# Patient Record
Sex: Female | Born: 1990 | Race: White | Hispanic: No | Marital: Single | Smoking: Never smoker
Health system: Southern US, Community
[De-identification: ages and names within clinical notes are randomized; demographics above are authoritative.]

---
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
## 2008-01-05 ENCOUNTER — Emergency Department: Payer: Self-pay | Admitting: Emergency Medicine

## 2008-04-22 ENCOUNTER — Emergency Department: Payer: Self-pay | Admitting: Emergency Medicine

## 2009-03-02 ENCOUNTER — Emergency Department: Payer: Self-pay | Admitting: Emergency Medicine

## 2009-11-18 ENCOUNTER — Inpatient Hospital Stay: Payer: Self-pay | Admitting: Internal Medicine

## 2010-02-12 ENCOUNTER — Inpatient Hospital Stay: Payer: Self-pay | Admitting: Obstetrics and Gynecology

## 2011-09-10 ENCOUNTER — Emergency Department: Payer: Self-pay | Admitting: Emergency Medicine

## 2012-10-26 ENCOUNTER — Emergency Department: Payer: Self-pay | Admitting: Emergency Medicine

## 2012-10-26 LAB — URINALYSIS, COMPLETE
Leukocyte Esterase: NEGATIVE
Nitrite: NEGATIVE
Ph: 5 (ref 4.5–8.0)
Protein: NEGATIVE
RBC,UR: 3 /HPF (ref 0–5)
Specific Gravity: 1.034 (ref 1.003–1.030)
Squamous Epithelial: <1
WBC UR: 2 /HPF (ref 0–5)

## 2013-03-28 ENCOUNTER — Emergency Department: Payer: Self-pay | Admitting: Emergency Medicine

## 2013-03-28 LAB — URINALYSIS, COMPLETE
Protein: NEGATIVE
RBC,UR: 2 /HPF (ref 0–5)
Squamous Epithelial: <1
WBC UR: 1 /HPF (ref 0–5)

## 2013-03-28 LAB — DRUG SCREEN, URINE
Benzodiazepine, Ur Scrn: NEGATIVE (ref ?–200)
Cannabinoid 50 Ng, Ur ~~LOC~~: NEGATIVE (ref ?–50)
Cocaine Metabolite,Ur ~~LOC~~: NEGATIVE (ref ?–300)
Methadone, Ur Screen: NEGATIVE (ref ?–300)
Phencyclidine (PCP) Ur S: NEGATIVE (ref ?–25)

## 2013-03-28 LAB — CBC
HCT: 37.9 % (ref 35.0–47.0)
MCH: 29.8 pg (ref 26.0–34.0)
MCHC: 34.2 g/dL (ref 32.0–36.0)
MCV: 87 fL (ref 80–100)
Platelet: 221 10*3/uL (ref 150–440)
RBC: 4.35 10*6/uL (ref 3.80–5.20)
RDW: 13.8 % (ref 11.5–14.5)
WBC: 6.3 10*3/uL (ref 3.6–11.0)

## 2013-03-29 LAB — BASIC METABOLIC PANEL
BUN: 12 mg/dL (ref 7–18)
Chloride: 108 mmol/L — ABNORMAL HIGH (ref 98–107)
Co2: 22 mmol/L (ref 21–32)
Osmolality: 270 (ref 275–301)
Potassium: 3.6 mmol/L (ref 3.5–5.1)
Sodium: 135 mmol/L — ABNORMAL LOW (ref 136–145)

## 2013-10-30 ENCOUNTER — Emergency Department: Payer: Self-pay | Admitting: Emergency Medicine

## 2013-12-03 ENCOUNTER — Emergency Department: Payer: Self-pay | Admitting: Emergency Medicine

## 2014-05-29 ENCOUNTER — Emergency Department: Payer: Self-pay | Admitting: Emergency Medicine

## 2014-05-31 LAB — BETA STREP CULTURE(ARMC)

## 2014-06-12 ENCOUNTER — Emergency Department: Payer: Self-pay | Admitting: Emergency Medicine

## 2014-06-15 LAB — BETA STREP CULTURE(ARMC)

## 2016-03-15 ENCOUNTER — Emergency Department: Payer: Worker's Compensation

## 2016-03-15 ENCOUNTER — Emergency Department
Admission: EM | Admit: 2016-03-15 | Discharge: 2016-03-15 | Disposition: A | Discharge status: Home / Self Care | Payer: Worker's Compensation | Attending: Emergency Medicine | Admitting: Emergency Medicine

## 2016-03-15 ENCOUNTER — Encounter: Payer: Self-pay | Admitting: Emergency Medicine

## 2016-03-15 DIAGNOSIS — S61211A Laceration without foreign body of left index finger without damage to nail, initial encounter: Secondary | ICD-10-CM | POA: Insufficient documentation

## 2016-03-15 DIAGNOSIS — Y9389 Activity, other specified: Secondary | ICD-10-CM | POA: Insufficient documentation

## 2016-03-15 DIAGNOSIS — S61311A Laceration without foreign body of left index finger with damage to nail, initial encounter: Secondary | ICD-10-CM

## 2016-03-15 DIAGNOSIS — Y929 Unspecified place or not applicable: Secondary | ICD-10-CM | POA: Diagnosis not present

## 2016-03-15 DIAGNOSIS — Y990 Civilian activity done for income or pay: Secondary | ICD-10-CM | POA: Insufficient documentation

## 2016-03-15 DIAGNOSIS — W268XXA Contact with other sharp object(s), not elsewhere classified, initial encounter: Secondary | ICD-10-CM | POA: Diagnosis not present

## 2016-03-15 MED ORDER — HYDROCODONE-ACETAMINOPHEN 5-325 MG PO TABS: 1.0000 | ORAL_TABLET | Freq: Four times a day (QID) | ORAL | Status: DC | PRN

## 2016-03-15 MED ORDER — LIDOCAINE-EPINEPHRINE-TETRACAINE (LET) SOLUTION
3.0000 mL | Freq: Once | NASAL | Status: AC
  Administered 2016-03-15: 3 mL via TOPICAL
  Filled 2016-03-15: qty 3

## 2016-03-15 NOTE — ED Notes (Signed)
States she was cleaning a meat slicer at work and cut her tip of left index finger

## 2016-03-15 NOTE — Discharge Instructions (Signed)
Please call the hand specialist tomorrow to schedule a follow up.   Nonsutured Laceration Care A laceration is a cut that goes through all layers of the skin and extends into the tissue that is right under the skin. This type of cut is usually stitched up (sutured) or closed with tape (adhesive strips) or skin glue shortly after the injury happens. However, if the wound is dirty or if several hours pass before medical treatment is provided, it is likely that germs (bacteria) will enter the wound. Closing a laceration after bacteria have entered it increases the risk of infection. In these cases, your health care provider may leave the laceration open (nonsutured) and cover it with a bandage. This type of treatment helps prevent infection and allows the wound to heal from the deepest layer of tissue damage up to the surface. An open fracture is a type of injury that may involve nonsutured lacerations. An open fracture is a break in a bone that happens along with one or more lacerations through the skin that is near the fracture site. HOW TO CARE FOR YOUR NONSUTURED LACERATION  Take or apply over-the-counter and prescription medicines only as told by your health care provider.  If you were prescribed an antibiotic medicine, take or apply it as told by your health care provider. Do not stop using the antibiotic even if your condition improves.  Clean the wound one time each day or as told by your health care provider.  Wash the wound with mild soap and water.  Rinse the wound with water to remove all soap.  Pat your wound dry with a clean towel. Do not rub the wound.  Do not inject anything into the wound unless your health care provider told you to.  Change any bandages (dressings) as told by your health care provider. This includes changing the dressing if it gets wet, dirty, or starts to smell bad.  Keep the dressing dry until your health care provider says it can be removed. Do not take  baths, swim, or do anything that puts your wound underwater until your health care provider approves.  Raise (elevate) the injured area above the level of your heart while you are sitting or lying down, if possible.  Do not scratch or pick at the wound.  Check your wound every day for signs of infection. Watch for:  Redness, swelling, or pain.  Fluid, blood, or pus.  Keep all follow-up visits as told by your health care provider. This is important. SEEK MEDICAL CARE IF:  You received a tetanus and shot and you have swelling, severe pain, redness, or bleeding at the injection site.   You have a fever.  Your pain is not controlled with medicine.  You have increased redness, swelling, or pain at the site of your wound.  You have fluid, blood, or pus coming from your wound.  You notice a bad smell coming from your wound or your dressing.  You notice something coming out of the wound, such as wood or glass.  You notice a change in the color of your skin near your wound.  You develop a new rash.  You need to change the dressing frequently due to fluid, blood, or pus draining from the wound.  You develop numbness around your wound. SEEK IMMEDIATE MEDICAL CARE IF:  Your pain suddenly increases and is severe.  You develop severe swelling around the wound.  The wound is on your hand or foot and you cannot properly  move a finger or toe.  The wound is on your hand or foot and you notice that your fingers or toes look pale or bluish.  You have a red streak going away from your wound.   This information is not intended to replace advice given to you by your health care provider. Make sure you discuss any questions you have with your health care provider.   Document Released: 09/16/2006 Document Revised: 03/05/2015 Document Reviewed: 10/15/2014 Elsevier Interactive Patient Education [employer]

## 2016-03-15 NOTE — ED Notes (Signed)
Spoke with pt supervisor RYAN at 11:55am at phone number [phone], he stated, " don't do a urine drug screen", advised Bonita QuinLinda, RCharity fundraiser

## 2016-03-15 NOTE — ED Provider Notes (Signed)
[facility] Emergency Department Provider Note  ____________________________________________  Time seen: Approximately 12:11 PM  I have reviewed the triage vital signs and the nursing notes.   HISTORY  Chief Complaint No chief complaint on file.    HPI Alexandria Chan is a 25 y.o. female , NAD, presents to the emergency department with one hour history of laceration to the left index finger. 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 She states the machine was off so she is uncertain of how the laceration occurred. Notes significant bleeding from the area as well as severe pain. Patient covered the area with a wet terry cloth towel and presented to the emergency department. Denies any numbness, weakness, tingling this time. Does have full range of motion of the left hand and all digits. No known previous injuries   History reviewed. No pertinent past medical history.  There are no active problems to display for this patient.   History reviewed. No pertinent past surgical history.  Current Outpatient Rx  Name  Route  Sig  Dispense  Refill  . HYDROcodone-acetaminophen (NORCO) 5-325 MG tablet   Oral   Take 1 tablet by mouth every 6 (six) hours as needed for severe pain.   6 tablet   0     Allergies Review of patient's allergies indicates no known allergies.  No family history on file.  Social History Social History  Substance Use Topics  . Smoking status: Never Smoker   . Smokeless tobacco: None  . Alcohol Use: No     Review of Systems  Constitutional: No fever/chills Eyes: No visual changes. Cardiovascular: No chest pain. Respiratory: No shortness of breath. Musculoskeletal: Positive for left index finger pain. Negative for left hand or wrist pain.   Skin: Positive laceration left index finger. Negative for rash. Neurological: Negative for headaches, focal weakness or numbness. No  tingling 10-point ROS otherwise negative.  ____________________________________________   PHYSICAL EXAM:  VITAL SIGNS: ED Triage Vitals  Enc Vitals Group     BP 03/15/16 1202 141/104 mmHg     Pulse Rate 03/15/16 1202 79     Resp 03/15/16 1202 20     Temp 03/15/16 1202 98.5 F (36.9 C)     Temp Source 03/15/16 1202 Oral     SpO2 03/15/16 1202 96 %     Weight 03/15/16 1202 173 lb (78.472 kg)     Height 03/15/16 1202 5\' 4"  (1.626 m)     Head Cir --      Peak Flow --      Pain Score --      Pain Loc --      Pain Edu? --      Excl. in GC? --      Constitutional: Alert and oriented. Well appearing and in no acute distress. Eyes: Conjunctivae are normal.  Head: Atraumatic. Cardiovascular:  Good peripheral circulation with 2+ pulses in the left upper extremity. Capillary refill is brisk in all digits of the left hand. Respiratory: Normal respiratory effort without tachypnea or retractions.  Neurologic:  Normal speech and language. No gross focal neurologic deficits are appreciated. Gait and posture is normal. Sensation about the left index finger grossly intact to light touch. Skin:  1 cm oblong soft tissue defect laceration to the tip of the left index finger along with approximately 1/5 of the tip of the left index finger nail removed. Skin is warm, dry.  No rash noted. Psychiatric: Mood and affect are normal. Speech and behavior are normal. Patient exhibits appropriate insight and judgement.   ____________________________________________   LABS  None ____________________________________________  EKG  None ____________________________________________  RADIOLOGY I have personally viewed and evaluated these images (plain radiographs) as part of my medical decision making, as well as reviewing the written report by the radiologist.  No results found.  ____________________________________________    PROCEDURES  Procedure(s) performed: LACERATION REPAIR Performed  by: Hope Pigeon Authorized by: Hope Pigeon Consent: Verbal consent obtained. Risks and benefits: risks, benefits and alternatives were discussed Consent given by: patient Patient identity confirmed: provided demographic data Prepped and Draped in normal sterile fashion Wound explored  Laceration Location: Tip of left index finger  Laceration Length: 1 cm oblong soft tissue defect.   No Foreign Bodies seen or palpated  Anesthesia: LET  Local anesthetic: LET  Anesthetic total: 3 ml  Irrigation method: Soak in saline and Betadine  Amount of cleaning: standard  Skin closure: Wound was left open but Surgicel was applied    Patient tolerance: Patient tolerated the procedure well with no immediate complications.     Medications  lidocaine-EPINEPHrine-tetracaine (LET) solution (3 mLs Topical Given 03/15/16 1212)     ____________________________________________   INITIAL IMPRESSION / ASSESSMENT AND PLAN / ED COURSE  Pertinent imaging results that were available during my care of the patient were reviewed by me and considered in my medical decision making (see chart for details).  Patient's diagnosis is consistent with laceration to left index finger without foreign body with damage to the nail. Patient will be discharged home with prescriptions for Norco to take as needed for severe pain. Patient is to [place] up [place] [facility] [place]. Patient is given ED precautions to return to the ED for any worsening or new symptoms.     ____________________________________________  FINAL CLINICAL IMPRESSION(S) / ED DIAGNOSES  Final diagnoses:  Laceration of left index finger w/o foreign body with damage to nail, initial encounter      NEW MEDICATIONS STARTED DURING THIS VISIT:  New Prescriptions   HYDROCODONE-ACETAMINOPHEN (NORCO) 5-325 MG TABLET    Take 1 tablet by mouth every 6 (six) hours as needed for  severe pain.         Hope Pigeon, PA-C 03/15/16 1253  Jeanmarie Plant, MD 03/15/16 1538

## 2016-08-25 ENCOUNTER — Emergency Department
Admission: EM | Admit: 2016-08-25 | Discharge: 2016-08-25 | Disposition: A | Discharge status: Home / Self Care | Payer: Self-pay | Attending: Emergency Medicine | Admitting: Emergency Medicine

## 2016-08-25 ENCOUNTER — Emergency Department: Payer: Self-pay

## 2016-08-25 ENCOUNTER — Encounter: Payer: Self-pay | Admitting: *Deleted

## 2016-08-25 DIAGNOSIS — Y929 Unspecified place or not applicable: Secondary | ICD-10-CM | POA: Insufficient documentation

## 2016-08-25 DIAGNOSIS — Y999 Unspecified external cause status: Secondary | ICD-10-CM | POA: Insufficient documentation

## 2016-08-25 DIAGNOSIS — Y9301 Activity, walking, marching and hiking: Secondary | ICD-10-CM | POA: Insufficient documentation

## 2016-08-25 DIAGNOSIS — S93402A Sprain of unspecified ligament of left ankle, initial encounter: Secondary | ICD-10-CM | POA: Insufficient documentation

## 2016-08-25 DIAGNOSIS — W108XXA Fall (on) (from) other stairs and steps, initial encounter: Secondary | ICD-10-CM | POA: Insufficient documentation

## 2016-08-25 MED ORDER — HYDROCODONE-ACETAMINOPHEN 5-325 MG PO TABS
2.0000 | ORAL_TABLET | Freq: Once | ORAL | Status: AC
  Administered 2016-08-25: 2 via ORAL
  Filled 2016-08-25: qty 2

## 2016-08-25 NOTE — Discharge Instructions (Signed)
As we discussed, you do not have any broken or dislocated bones in your foot or ankle, but you do have an ankle sprain.  Please read through the included information about routine injury care (RICE = rest, ice, compression, elevation), and take over-the-counter pain medicine according to label instructions.  If you do not have any reason to avoid ibuprofen, you can also consider taking ibuprofen 600 mg 3 times a day with meals, but do this for no more than 5 days as it may cause to some stomach discomfort over time.  Use crutches if provided and you may bear weight as tolerated.  Follow-up is recommended with the orthopedic surgeon or with your regular doctor.  

## 2016-08-25 NOTE — ED Triage Notes (Signed)
Pt slipped and fell, pt landed on left ankle, pt denies hitting head

## 2016-08-25 NOTE — ED Provider Notes (Signed)
[facility] Emergency Department Provider Note  ____________________________________________   First MD Initiated Contact with Patient 08/25/16 1628     (approximate)  I have reviewed the triage vital signs and the nursing notes.   HISTORY  Chief Complaint Ankle Pain    HPI Alexandria Chan is a 25 y.o. female who presents for evaluation of pain in her left ankle.  The pain started acutely just prior to arrival after she slipped walking down the stairs and missed the last 2 steps.  She describes the pain as severe and sharp when she tries to move it or bear weight and mild to moderate at rest.  There is swelling is primarily located on the lateral aspect of the left ankle and some swelling in the foot.  She has no numbness nor tingling in the toes or the foot and no injuries or further up the leg.  She did not strike her head and denies headache, neck pain, loss of consciousness, difficulty breathing, and abdominal pain.   History reviewed. No pertinent past medical history.  There are no active problems to display for this patient.   History reviewed. No pertinent surgical history.  Prior to Admission medications   Medication Sig Start Date End Date Taking? Authorizing Provider  HYDROcodone-acetaminophen (NORCO) 5-325 MG tablet Take 1 tablet by mouth every 6 (six) hours as needed for severe pain. 03/15/16   Jami L Hagler, PA-C    Allergies Review of patient's allergies indicates no known allergies.  No family history on file.  Social History Social History  Substance Use Topics  . Smoking status: Never Smoker  . Smokeless tobacco: Not on file  . Alcohol use No    Review of Systems Constitutional: No fever/chills Cardiovascular: Denies chest pain. Respiratory: Denies shortness of breath. Gastrointestinal: No abdominal pain.  No nausea, no vomiting.   Musculoskeletal: Negative for back pain.  Severe left ankle pain Neurological: Negative  for headaches, focal weakness or numbness.  ____________________________________________   PHYSICAL EXAM:  VITAL SIGNS: ED Triage Vitals  Enc Vitals Group     BP 08/25/16 1615 127/84     Pulse Rate 08/25/16 1615 90     Resp 08/25/16 1615 20     Temp 08/25/16 1615 98.1 F (36.7 C)     Temp Source 08/25/16 1615 Oral     SpO2 08/25/16 1615 98 %     Weight 08/25/16 1611 170 lb (77.1 kg)     Height 08/25/16 1611 5\' 3"  (1.6 m)     Head Circumference --      Peak Flow --      Pain Score 08/25/16 1611 6     Pain Loc --      Pain Edu? --      Excl. in GC? --     Constitutional: Alert and oriented. Well appearing but obviously uncomfortable Eyes: Conjunctivae are normal. PERRL. EOMI. Head: Atraumatic. Cardiovascular: Normal rate, regular rhythm. Good peripheral circulation.  Respiratory: Normal respiratory effort.  No retractions.  Gastrointestinal: Soft and nontender. No distention.  Musculoskeletal: Obvious swelling without ecchymosis of her lateral malleolus of the left ankle.  Severely tender to palpation.  Neurovascularly intact.  No tenderness over the medial malleolus.  No significant midfoot tenderness.  Unable to assess joint laxity given the patient's tenderness in the ankle but the patient has no tenderness of the knee or more proximal leg Neurologic:  Normal speech and language. No gross focal neurologic deficits are appreciated.  Skin:  Skin is warm, dry and intact. No rash noted. Psychiatric: Mood and affect are normal. Speech and behavior are normal.  ____________________________________________   LABS (all labs ordered are listed, but only abnormal results are displayed)  Labs Reviewed - No data to display  ____________________________________________   RADIOLOGY   Dg Ankle Complete Left  Result Date: 08/25/2016 CLINICAL DATA:  Status post fall, ankle pain EXAM: LEFT ANKLE COMPLETE - 3+ VIEW COMPARISON:  None. FINDINGS: There is no evidence of fracture,  dislocation, or joint effusion. There is no evidence of arthropathy or other focal bone abnormality. Severe soft tissue swelling over the lateral malleolus. IMPRESSION: No acute osseous injury of the left ankle. Electronically Signed   By: Elige Ko   On: 08/25/2016 16:54    ____________________________________________   PROCEDURES  Procedure(s) performed:   Procedures   Critical Care performed: No ____________________________________________   INITIAL IMPRESSION / ASSESSMENT AND PLAN / ED COURSE  Pertinent labs & imaging results that were available during my care of the patient were reviewed by me and considered in my medical decision making (see chart for details).   Clinical Course  Comment By Time  No evidence of fracture or dislocation on radiographs.  Ace wrap, crutches, RICE, outpatient follow up.  Patient agrees with plan Loleta Rose, MD 10/24 1729       ____________________________________________  FINAL CLINICAL IMPRESSION(S) / ED DIAGNOSES  Final diagnoses:  Sprain of left ankle, unspecified ligament, initial encounter     MEDICATIONS GIVEN DURING THIS VISIT:  Medications  HYDROcodone-acetaminophen (NORCO/VICODIN) 5-325 MG per tablet 2 tablet (2 tablets Oral Given 08/25/16 1640)     NEW OUTPATIENT MEDICATIONS STARTED DURING THIS VISIT:  New Prescriptions   No medications on file    Modified Medications   No medications on file    Discontinued Medications   No medications on file     Note:  This document was prepared using Dragon voice recognition software and may include unintentional dictation errors.    Loleta Rose, MD 08/25/16 [phone]

## 2017-11-23 ENCOUNTER — Encounter: Payer: Self-pay | Admitting: Emergency Medicine

## 2017-11-23 ENCOUNTER — Other Ambulatory Visit: Payer: Self-pay

## 2017-11-23 ENCOUNTER — Emergency Department
Admission: EM | Admit: 2017-11-23 | Discharge: 2017-11-23 | Disposition: A | Discharge status: Home / Self Care | Payer: Self-pay | Attending: Emergency Medicine | Admitting: Emergency Medicine

## 2017-11-23 ENCOUNTER — Emergency Department: Payer: Self-pay

## 2017-11-23 DIAGNOSIS — J111 Influenza due to unidentified influenza virus with other respiratory manifestations: Secondary | ICD-10-CM | POA: Insufficient documentation

## 2017-11-23 LAB — INFLUENZA PANEL BY PCR (TYPE A & B)
INFLAPCR: POSITIVE — AB
INFLBPCR: NEGATIVE

## 2017-11-23 MED ORDER — ONDANSETRON 4 MG PO TBDP: 4.0000 mg | ORAL_TABLET | Freq: Three times a day (TID) | ORAL | 0 refills | Status: DC | PRN

## 2017-11-23 MED ORDER — BENZONATATE 100 MG PO CAPS: 100.0000 mg | ORAL_CAPSULE | Freq: Three times a day (TID) | ORAL | 0 refills | Status: AC | PRN

## 2017-11-23 MED ORDER — ALBUTEROL SULFATE (2.5 MG/3ML) 0.083% IN NEBU
5.0000 mg | INHALATION_SOLUTION | Freq: Once | RESPIRATORY_TRACT | Status: AC
  Administered 2017-11-23: 5 mg via RESPIRATORY_TRACT

## 2017-11-23 MED ORDER — ALBUTEROL SULFATE HFA 108 (90 BASE) MCG/ACT IN AERS: 2.0000 | INHALATION_SPRAY | Freq: Four times a day (QID) | RESPIRATORY_TRACT | 0 refills | Status: AC | PRN

## 2017-11-23 MED ORDER — ALBUTEROL SULFATE (2.5 MG/3ML) 0.083% IN NEBU
INHALATION_SOLUTION | RESPIRATORY_TRACT | Status: AC
  Filled 2017-11-23: qty 6

## 2017-11-23 MED ORDER — ACETAMINOPHEN-CODEINE #3 300-30 MG PO TABS: 1.0000 | ORAL_TABLET | Freq: Three times a day (TID) | ORAL | 0 refills | Status: AC | PRN

## 2017-11-23 NOTE — ED Notes (Signed)
Breathing treatment given at triage.  Pt with noted wheezing bilat.

## 2017-11-23 NOTE — ED Notes (Signed)
First Nurse Note:  Hx of cough.  Hoarse.  Chest pain with cough.

## 2017-11-23 NOTE — Discharge Instructions (Signed)
Your symptoms and lab confirm influenza (flu). Take the prescription meds, along with OTC cough and cold medicines for symptom relief. Drink plenty of fluids to prevent dehydration. Follow-up with one of the local community clinics for routine care or ongoing symptoms. Return to the ED for worsening symptoms.

## 2017-11-23 NOTE — ED Notes (Signed)
See triage note  Presents with tightness and wheezing which started yesterday   Low grade fever on arrival  Was given SVN in triage  States tightness is better  Wheezing decreased

## 2017-11-23 NOTE — ED Provider Notes (Signed)
[facility] Emergency Department Provider Note ____________________________________________  Time seen: 85  I have reviewed the triage vital signs and the nursing notes.  HISTORY  Chief Complaint  Cough; Nasal Congestion; and Fever  HPI Alexandria Chan is a 27 y.o. female presents to the ED for evaluation of cough, congestion, shortness of breath, and wheezing.  Patient is also had fevers with onset of her symptoms yesterday.  She has a history of reactive airways and uses butyryl MDI.  She describes unresolved shortness of breath with use of her albuterol inhaler.  She reports her daughter had a recent respiratory infection including fevers and nausea but seems to have improved.  Patient did not receive the seasonal flu vaccine.  Denies any chest pain or weakness.   History reviewed. No pertinent past medical history.  There are no active problems to display for this patient.  History reviewed. No pertinent surgical history.  Prior to Admission medications   Medication Sig Start Date End Date Taking? Authorizing Provider  acetaminophen-codeine (TYLENOL #3) 300-30 MG tablet Take 1 tablet by mouth every 8 (eight) hours as needed for moderate pain. 11/23/17   Zelma Mazariego, Charlesetta Ivory, PA-C  albuterol (PROVENTIL HFA;VENTOLIN HFA) 108 (90 Base) MCG/ACT inhaler Inhale 2 puffs into the lungs every 6 (six) hours as needed for wheezing or shortness of breath. 11/23/17   Jennafer Gladue, Charlesetta Ivory, PA-C  benzonatate (TESSALON PERLES) 100 MG capsule Take 1 capsule (100 mg total) by mouth 3 (three) times daily as needed for cough (Take 1-2 per dose). 11/23/17   Roxanna Mcever, Charlesetta Ivory, PA-C  ondansetron (ZOFRAN ODT) 4 MG disintegrating tablet Take 1 tablet (4 mg total) by mouth every 8 (eight) hours as needed. 11/23/17   Sanaya Gwilliam, Charlesetta Ivory, PA-C    Allergies Patient has no known allergies.  History reviewed. No pertinent family history.  Social History Social History    Tobacco Use  . Smoking status: Never Smoker  . Smokeless tobacco: Never Used  Substance Use Topics  . Alcohol use: No  . Drug use: Not on file    Review of Systems  Constitutional: Positive for fever. Eyes: Negative for visual changes. ENT: Negative for sore throat. Cardiovascular: Negative for chest pain. Respiratory: Positive for shortness of breath. Gastrointestinal: Negative for abdominal pain, vomiting and diarrhea. Reports nausea Genitourinary: Negative for dysuria. Musculoskeletal: Negative for back pain. Skin: Negative for rash. Neurological: Negative for headaches, focal weakness or numbness. ____________________________________________  PHYSICAL EXAM:  VITAL SIGNS: ED Triage Vitals  Enc Vitals Group     BP 11/23/17 0952 (!) 123/56     Pulse Rate 11/23/17 0952 (!) 129     Resp 11/23/17 0952 (!) 26     Temp 11/23/17 0952 99.8 F (37.7 C)     Temp Source 11/23/17 0952 Oral     SpO2 11/23/17 0952 97 %     Weight 11/23/17 0954 180 lb (81.6 kg)     Height 11/23/17 0954 5\' 4"  (1.626 m)     Head Circumference --      Peak Flow --      Pain Score 11/23/17 0954 7     Pain Loc --      Pain Edu? --      Excl. in GC? --     Constitutional: Alert and oriented. Well appearing and in no distress. Head: Normocephalic and atraumatic. Eyes: Conjunctivae are normal. PERRL. Normal extraocular movements Ears: Canals clear. TMs intact bilaterally. Nose: No congestion/rhinorrhea/epistaxis. Mouth/Throat:  Mucous membranes are moist.  We will is midline and tonsils are flat.  No oropharyngeal lesions are appreciated. Neck: Supple. No thyromegaly. Hematological/Lymphatic/Immunological: No cervical lymphadenopathy. Cardiovascular: Tachy rate, regular rhythm.  No murmurs, rubs, or gallops.  Normal S1, S2.  Normal distal pulses. Respiratory: Normal respiratory effort.. No wheezes/rales Mild rhonchi noted bilaterally Gastrointestinal: Soft and nontender. No  distention. Musculoskeletal: Nontender with normal range of motion in all extremities.  Neurologic:  Normal gait without ataxia. Normal speech and language. No gross focal neurologic deficits are appreciated. Skin:  Skin is warm, dry and intact. No rash noted. Psychiatric: Mood and affect are normal. Patient exhibits appropriate insight and judgment. ____________________________________________   LABS (pertinent positives/negatives)  Labs Reviewed  INFLUENZA PANEL BY PCR (TYPE A & B) - Abnormal; Notable for the following components:      Result Value   Influenza A By PCR POSITIVE (*)    All other components within normal limits  ____________________________________________  EKG  Sinus Tachy 131 bpm Normal axis No STEMI _____________________________________________  RADIOLOGY  CXR  IMPRESSION: No active cardiopulmonary disease. ____________________________________________  INITIAL IMPRESSION / ASSESSMENT AND PLAN / ED COURSE  Patient presents to the ED for sudden onset of cough, congestion, fevers, and nausea.  Patient's exam is overall benign her vital signs reveal some tachycardia and mild tachypnea on initial presentation.  She reports improvement after a nebulizer treatment in the triage section.  Her labs can firm influenza A and her chest x-ray is negative for any acute cardiopulmonary process.  Patient will be discharged for Tylenol 3, albuterol, Tessalon Perles, and Zofran.  She will follow-up with her primary care provider or return to the ED for worsening symptoms as discussed.  A work note is provided for the remainder of the week. ____________________________________________  FINAL CLINICAL IMPRESSION(S) / ED DIAGNOSES  Final diagnoses:  Influenza      Lissa HoardMenshew, Sarae Nicholes V Bacon, PA-C 11/23/17 1659    Jene EveryKinner, Robert, MD 11/26/17 1103

## 2017-11-23 NOTE — ED Triage Notes (Addendum)
Pt to ed with c/o cough, congestion and fever and sob that started yesterday.  Pt states she is having difficulty breathing, wheezing noted bilat, sats 97% on RA, pt tachypneic.  Pt states daughter with recent resp illness.

## 2018-01-11 ENCOUNTER — Other Ambulatory Visit: Payer: Self-pay

## 2018-01-11 ENCOUNTER — Emergency Department
Admission: EM | Admit: 2018-01-11 | Discharge: 2018-01-11 | Disposition: A | Discharge status: Home / Self Care | Payer: Self-pay | Attending: Emergency Medicine | Admitting: Emergency Medicine

## 2018-01-11 DIAGNOSIS — A6920 Lyme disease, unspecified: Secondary | ICD-10-CM | POA: Insufficient documentation

## 2018-01-11 DIAGNOSIS — Z79899 Other long term (current) drug therapy: Secondary | ICD-10-CM | POA: Insufficient documentation

## 2018-01-11 MED ORDER — DOXYCYCLINE HYCLATE 100 MG PO TABS: 100.0000 mg | ORAL_TABLET | Freq: Two times a day (BID) | ORAL | 0 refills | Status: AC

## 2018-01-11 MED ORDER — DOXYCYCLINE HYCLATE 100 MG PO TABS
100.0000 mg | ORAL_TABLET | Freq: Once | ORAL | Status: AC
  Administered 2018-01-11: 100 mg via ORAL
  Filled 2018-01-11: qty 1

## 2018-01-11 NOTE — ED Notes (Signed)
A week and a half ago pt visualized a tick on her back. She is now having a rash that does not itch but is uncomfortable. Rash is red and circular with a "dot" in the middle of the circle. No reported pain.

## 2018-01-11 NOTE — ED Triage Notes (Addendum)
Pt states she pulled a tick off her back 2 weeks now has developed a rash around the area. Denies any symptoms, denies any fever or nausea. Pt states does have some itching.

## 2018-01-11 NOTE — Discharge Instructions (Signed)
You are being treated for a common tickborne disease. Take the antibiotic as directed. Follow-up with your provider as needed.

## 2018-01-12 NOTE — ED Provider Notes (Signed)
[facility] Emergency Department Provider Note ____________________________________________  Time seen: 2117  I have reviewed the triage vital signs and the nursing notes.  HISTORY  Chief Complaint  Rash  HPI Alexandria Chan is a 27 y.o. female presents herself to the ED for evaluation of a rash to the area where she pulled a tick off about a week and a half earlier.  Patient describes being at an outdoor wedding and a large farm, on Saturday, March 2.  By Monday, March 4, she pulled a small tic off the middle of her lower back.  She noted yesterday the development of a red ring around the area of the tick bite patient denies any fevers, chills, sweats, joint pain, muscle pain.  No past medical history on file.  There are no active problems to display for this patient.  No past surgical history on file.  Prior to Admission medications   Medication Sig Start Date End Date Taking? Authorizing Provider  acetaminophen-codeine (TYLENOL #3) 300-30 MG tablet Take 1 tablet by mouth every 8 (eight) hours as needed for moderate pain. 11/23/17   Tekla Malachowski, Charlesetta IvoryJenise V Bacon, PA-C  albuterol (PROVENTIL HFA;VENTOLIN HFA) 108 (90 Base) MCG/ACT inhaler Inhale 2 puffs into the lungs every 6 (six) hours as needed for wheezing or shortness of breath. 11/23/17   Claude Waldman, Charlesetta IvoryJenise V Bacon, PA-C  benzonatate (TESSALON PERLES) 100 MG capsule Take 1 capsule (100 mg total) by mouth 3 (three) times daily as needed for cough (Take 1-2 per dose). 11/23/17   Hayven Fatima, Charlesetta IvoryJenise V Bacon, PA-C  doxycycline (VIBRA-TABS) 100 MG tablet Take 1 tablet (100 mg total) by mouth 2 (two) times daily for 7 days. 01/11/18 01/18/18  Jadea Shiffer, Charlesetta IvoryJenise V Bacon, PA-C  ondansetron (ZOFRAN ODT) 4 MG disintegrating tablet Take 1 tablet (4 mg total) by mouth every 8 (eight) hours as needed. 11/23/17   Keyasia Jolliff, Charlesetta IvoryJenise V Bacon, PA-C    Allergies Patient has no known allergies.  No family history on file.  Social History Social  History   Tobacco Use  . Smoking status: Never Smoker  . Smokeless tobacco: Never Used  Substance Use Topics  . Alcohol use: No  . Drug use: Not on file    Review of Systems  Constitutional: Negative for fever. Cardiovascular: Negative for chest pain. Respiratory: Negative for shortness of breath. Gastrointestinal: Negative for abdominal pain, vomiting and diarrhea. Musculoskeletal: Negative for back pain. Skin: Positive for rash. Neurological: Negative for headaches, focal weakness or numbness. ____________________________________________  PHYSICAL EXAM:  VITAL SIGNS: ED Triage Vitals  Enc Vitals Group     BP 01/11/18 2052 (!) 150/103     Pulse Rate 01/11/18 2052 88     Resp 01/11/18 2052 20     Temp 01/11/18 2052 98.9 F (37.2 C)     Temp Source 01/11/18 2052 Oral     SpO2 01/11/18 2052 100 %     Weight 01/11/18 2053 180 lb (81.6 kg)     Height 01/11/18 2053 5\' 3"  (1.6 m)     Head Circumference --      Peak Flow --      Pain Score --      Pain Loc --      Pain Edu? --      Excl. in GC? --     Constitutional: Alert and oriented. Well appearing and in no distress. Head: Normocephalic and atraumatic. Eyes: Conjunctivae are normal. Normal extraocular movements Cardiovascular: Normal rate, regular rhythm. Normal distal pulses.  Respiratory: Normal respiratory effort. No wheezes/rales/rhonchi. Musculoskeletal: Nontender with normal range of motion in all extremities.  Neurologic:  Normal gait without ataxia. Normal speech and language. No gross focal neurologic deficits are appreciated. Skin:  Skin is warm, dry and intact.  Patient with a erythematous macular rash in the circumcision differential pattern with central clearing and a recent tick bite in the center.  The areas in the midline of the lower back at her waistline. ____________________________________________  PROCEDURES  Procedures Doxycycline 100 mg  PO ____________________________________________  INITIAL IMPRESSION / ASSESSMENT AND PLAN / ED COURSE  Patient with ED evaluation of a classic EM rash of Lyme disease.  Patient is about 10 days status post a confirmed tick bite to the lower back.  She denies any symptoms of arthralgias, myalgias, or generalized fevers.  She will be started empirically on doxycycline for tick borne disease.  She is advised to monitor the area for any worsening of her symptoms and return to the ED as necessary. ____________________________________________  FINAL CLINICAL IMPRESSION(S) / ED DIAGNOSES  Final diagnoses:  Lyme disease  Erythema migrans (Lyme disease)      Karmen Stabs, Charlesetta Ivory, PA-C 01/12/18 0016    Minna Antis, MD 01/12/18 2258

## 2018-02-03 ENCOUNTER — Encounter: Payer: Self-pay | Admitting: Emergency Medicine

## 2018-02-03 ENCOUNTER — Emergency Department
Admission: EM | Admit: 2018-02-03 | Discharge: 2018-02-03 | Disposition: A | Discharge status: Home / Self Care | Payer: Self-pay | Attending: Emergency Medicine | Admitting: Emergency Medicine

## 2018-02-03 DIAGNOSIS — J039 Acute tonsillitis, unspecified: Secondary | ICD-10-CM | POA: Insufficient documentation

## 2018-02-03 DIAGNOSIS — Z79899 Other long term (current) drug therapy: Secondary | ICD-10-CM | POA: Insufficient documentation

## 2018-02-03 MED ORDER — AMOXICILLIN 500 MG PO CAPS: 500.0000 mg | ORAL_CAPSULE | Freq: Three times a day (TID) | ORAL | 0 refills | Status: AC

## 2018-02-03 MED ORDER — DIPHENHYDRAMINE HCL 12.5 MG/5ML PO SYRP: 12.5000 mg | ORAL_SOLUTION | Freq: Four times a day (QID) | ORAL | 0 refills | Status: AC | PRN

## 2018-02-03 MED ORDER — LIDOCAINE VISCOUS 2 % MT SOLN
10.0000 mL | Freq: Once | OROMUCOSAL | Status: AC
  Administered 2018-02-03: 10 mL via OROMUCOSAL
  Filled 2018-02-03: qty 15

## 2018-02-03 MED ORDER — DIPHENHYDRAMINE HCL 12.5 MG/5ML PO ELIX
12.5000 mg | ORAL_SOLUTION | Freq: Once | ORAL | Status: AC
  Administered 2018-02-03: 12.5 mg via ORAL
  Filled 2018-02-03: qty 5

## 2018-02-03 MED ORDER — LIDOCAINE VISCOUS 2 % MT SOLN: 5.0000 mL | Freq: Four times a day (QID) | OROMUCOSAL | 0 refills | Status: AC | PRN

## 2018-02-03 NOTE — ED Provider Notes (Signed)
[facility] Emergency Department Provider Note   ____________________________________________   First MD Initiated Contact with Patient 02/03/18 1726     (approximate)  I have reviewed the triage vital signs and the nursing notes.   HISTORY  Chief Complaint Sore Throat    HPI Alexandria Chan is a 27 y.o. female patient complained of 4 days of sore throat.  Patient the pain has increased today.  Patient states unable to tolerate solids but can tolerate fluids.  Patient denies fever/chills.  Patient denies URI signs and symptoms.  Patient denies nausea, vomiting, diarrhea.  Patient rates the pain as a 9/10.  Patient described the pain as "acute soreness".  No palliative measures for complaint.  History reviewed. No pertinent past medical history.  There are no active problems to display for this patient.   History reviewed. No pertinent surgical history.  Prior to Admission medications   Medication Sig Start Date End Date Taking? Authorizing Provider  acetaminophen-codeine (TYLENOL #3) 300-30 MG tablet Take 1 tablet by mouth every 8 (eight) hours as needed for moderate pain. 11/23/17   Menshew, Charlesetta IvoryJenise V Bacon, PA-C  albuterol (PROVENTIL HFA;VENTOLIN HFA) 108 (90 Base) MCG/ACT inhaler Inhale 2 puffs into the lungs every 6 (six) hours as needed for wheezing or shortness of breath. 11/23/17   Menshew, Charlesetta IvoryJenise V Bacon, PA-C  amoxicillin (AMOXIL) 500 MG capsule Take 1 capsule (500 mg total) by mouth 3 (three) times daily. 02/03/18   Joni ReiningSmith, Shaunita Seney K, PA-C  benzonatate (TESSALON PERLES) 100 MG capsule Take 1 capsule (100 mg total) by mouth 3 (three) times daily as needed for cough (Take 1-2 per dose). 11/23/17   Menshew, Charlesetta IvoryJenise V Bacon, PA-C  diphenhydrAMINE (BENYLIN) 12.5 MG/5ML syrup Take 5 mLs (12.5 mg total) by mouth 4 (four) times daily as needed for allergies. Mix with 5 mL of viscous lidocaine for swish and swallow. 02/03/18   Joni ReiningSmith, Fallen Crisostomo K, PA-C  lidocaine  (XYLOCAINE) 2 % solution Use as directed 5 mLs in the mouth or throat every 6 (six) hours as needed for mouth pain. Mix with 5 mL of Benadryl for swish and swallow. 02/03/18   Joni ReiningSmith, Ericka Marcellus K, PA-C  ondansetron (ZOFRAN ODT) 4 MG disintegrating tablet Take 1 tablet (4 mg total) by mouth every 8 (eight) hours as needed. 11/23/17   Menshew, Charlesetta IvoryJenise V Bacon, PA-C    Allergies Patient has no known allergies.  No family history on file.  Social History Social History   Tobacco Use  . Smoking status: Never Smoker  . Smokeless tobacco: Never Used  Substance Use Topics  . Alcohol use: No  . Drug use: Not on file    Review of Systems Constitutional: No fever/chills Eyes: No visual changes. ENT: Sore throat Cardiovascular: Denies chest pain. Respiratory: Denies shortness of breath. Gastrointestinal: No abdominal pain.  No nausea, no vomiting.  No diarrhea.  No constipation. Genitourinary: Negative for dysuria. Musculoskeletal: Negative for back pain. Skin: Negative for rash. Neurological: Negative for headaches, focal weakness or numbness.   ____________________________________________   PHYSICAL EXAM:  VITAL SIGNS: ED Triage Vitals  Enc Vitals Group     BP 02/03/18 1739 (!) 142/76     Pulse Rate 02/03/18 1739 (!) 101     Resp 02/03/18 1739 20     Temp 02/03/18 1739 97.9 F (36.6 C)     Temp Source 02/03/18 1739 Oral     SpO2 02/03/18 1739 96 %     Weight 02/03/18 1740 180 lb (  81.6 kg)     Height 02/03/18 1740 5\' 3"  (1.6 m)     Head Circumference --      Peak Flow --      Pain Score 02/03/18 1740 9     Pain Loc --      Pain Edu? --      Excl. in GC? --     Constitutional: Alert and oriented. Well appearing and in no acute distress.  Mouth/Throat: Mucous membranes are moist.  Oropharynx erythematous.  Erythematous, edematous, and exudative tonsils. Neck: No stridor. Hematological/Lymphatic/Immunilogical: Bilateral cervical lymphadenopathy. Cardiovascular: Normal rate,  regular rhythm. Grossly normal heart sounds.  Good peripheral circulation. Respiratory: Normal respiratory effort.  No retractions. Lungs CTAB. Neurologic:  Normal speech and language. No gross focal neurologic deficits are appreciated. No gait instability. Skin:  Skin is warm, dry and intact. No rash noted. Psychiatric: Mood and affect are normal. Speech and behavior are normal.  ____________________________________________   LABS (all labs ordered are listed, but only abnormal results are displayed)  Labs Reviewed - No data to display ____________________________________________  EKG   ____________________________________________  RADIOLOGY  ED MD interpretation:    Official radiology report(s): No results found.  ____________________________________________   PROCEDURES  Procedure(s) performed: None  Procedures  Critical Care performed: No  ____________________________________________   INITIAL IMPRESSION / ASSESSMENT AND PLAN / ED COURSE  As part of my medical decision making, I reviewed the following data within the electronic MEDICAL RECORD NUMBER    Sore throat secondary to tonsillitis.  Patient given discharge care instruction.  Patient given a work note.  Patient advised to start medication as directed.  Patient advised [facility].      ____________________________________________   FINAL CLINICAL IMPRESSION(S) / ED DIAGNOSES  Final diagnoses:  Tonsillitis     ED Discharge Orders        Ordered    amoxicillin (AMOXIL) 500 MG capsule  3 times daily     02/03/18 1800    lidocaine (XYLOCAINE) 2 % solution  Every 6 hours PRN     02/03/18 1800    diphenhydrAMINE (BENYLIN) 12.5 MG/5ML syrup  4 times daily PRN     02/03/18 1800       Note:  This document was prepared using Dragon voice recognition software and may include unintentional dictation errors.    Joni Reining, PA-C 02/03/18  Mallie Snooks    Phineas Semen, MD 02/03/18 [phone]

## 2018-02-03 NOTE — ED Triage Notes (Signed)
First Nurse Note:  C/O sore throat x 3 days.

## 2018-02-03 NOTE — ED Triage Notes (Signed)
Pt arrived with complaints of sore throat since Monday. Tonsils appear swollen and red.

## 2018-11-10 IMAGING — CR DG CHEST 2V
1 series · 2 of 2 positions shown · non-contrast
Comparison: None.

CLINICAL DATA: Shortness of breath

EXAM:
CHEST  2 VIEW

[Series 1: dg chest 2 view · 0.14mm/px · 2 of 2 slices shown]
[im 1/2]
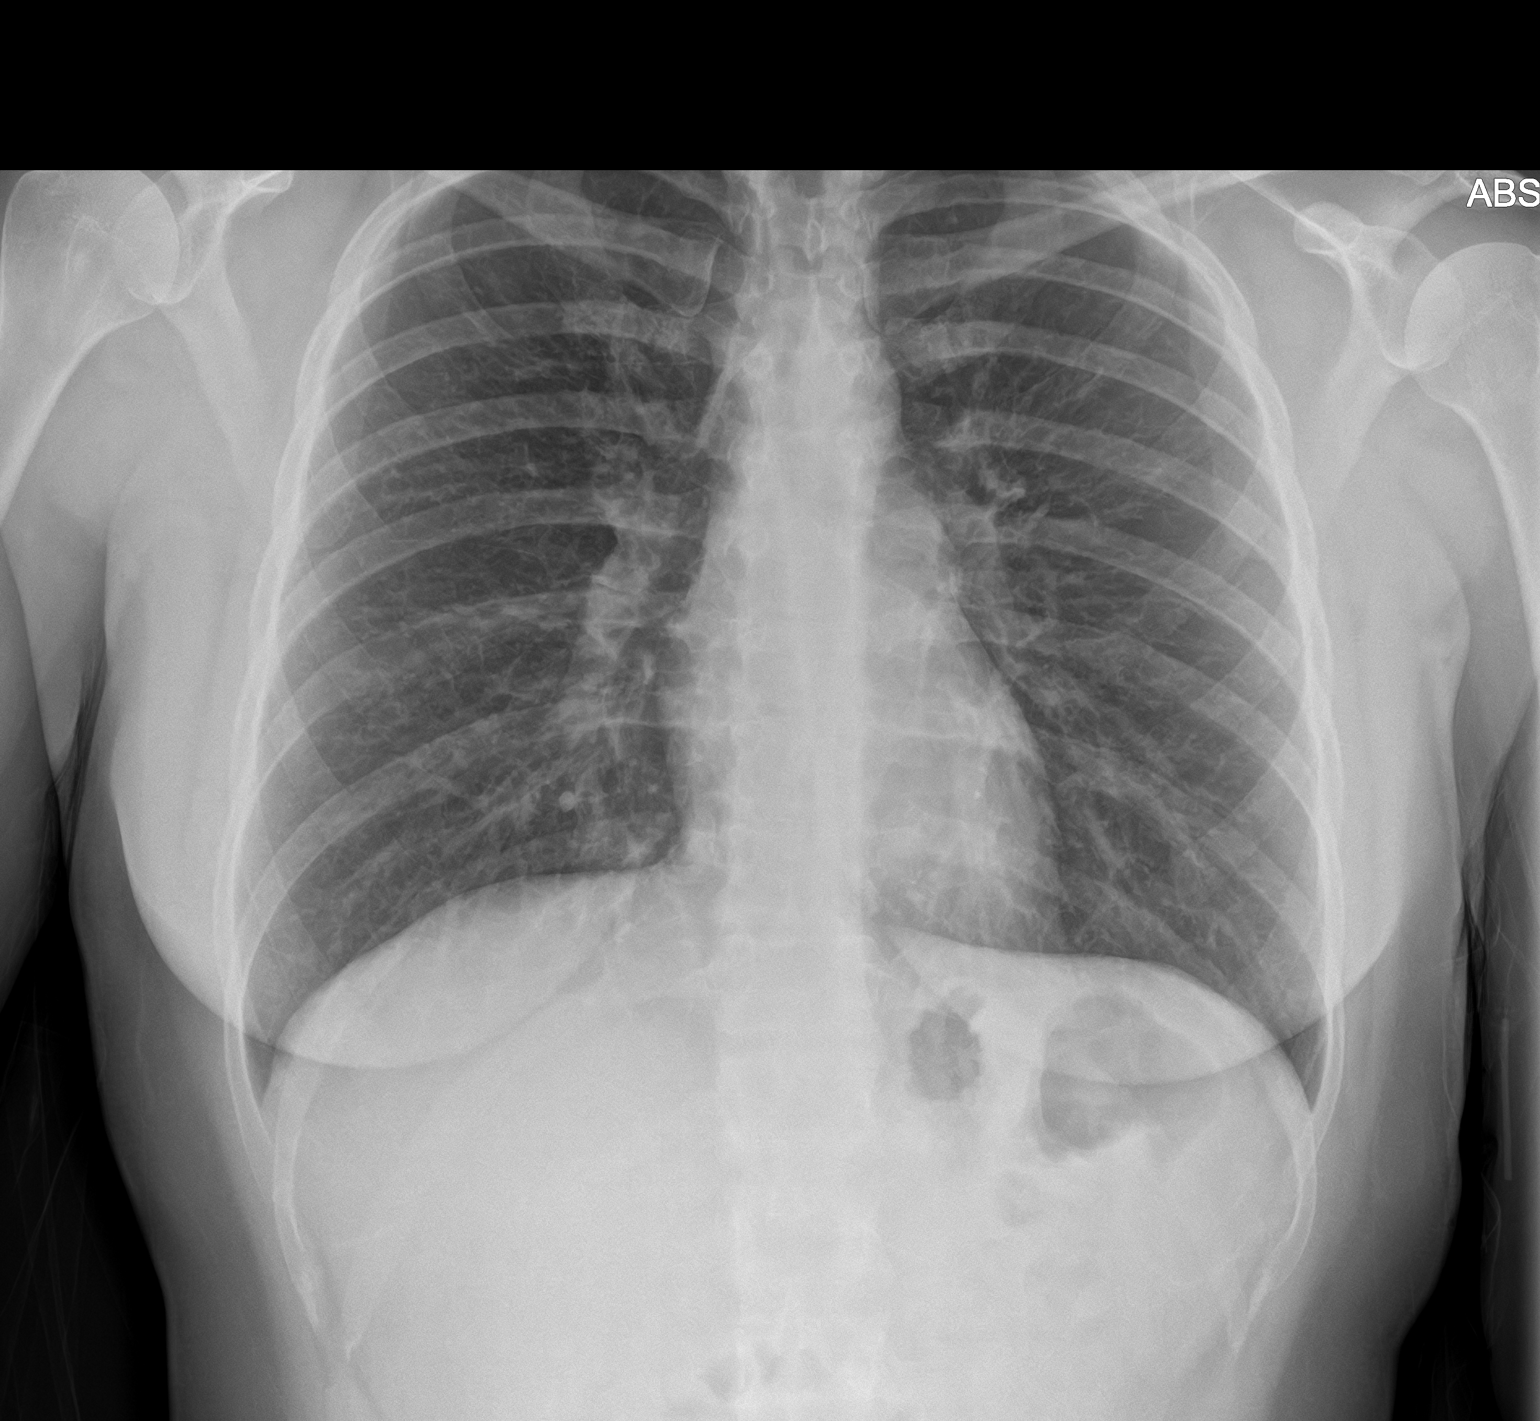
[im 2/2]
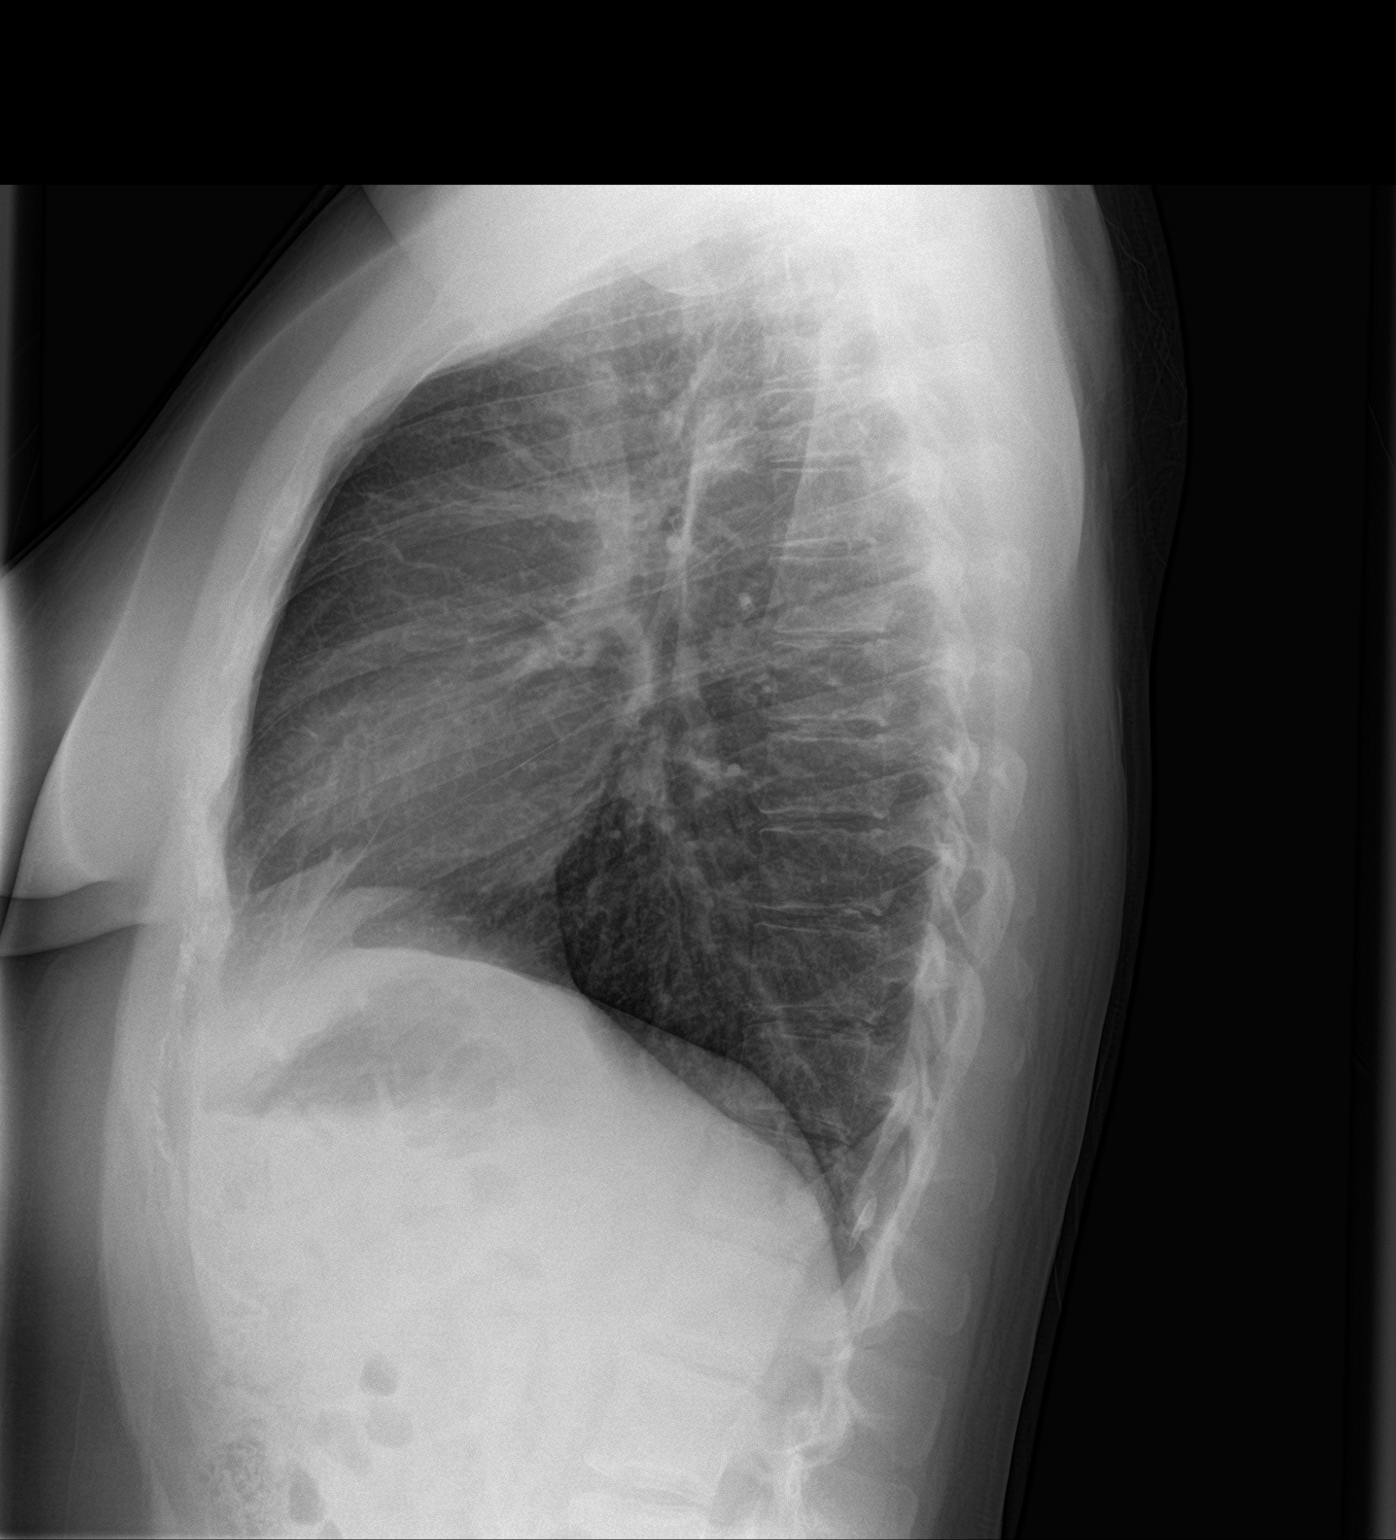

[2 of 2 positions shown; findings below may reference images not displayed]

FINDINGS: The heart size and mediastinal contours are within normal limits.
Both lungs are clear. The visualized skeletal structures are
unremarkable.
IMPRESSION: No active cardiopulmonary disease.

## 2022-08-29 ENCOUNTER — Ambulatory Visit: Payer: Self-pay

## 2022-08-29 DIAGNOSIS — Z23 Encounter for immunization: Secondary | ICD-10-CM

## 2023-03-03 ENCOUNTER — Other Ambulatory Visit: Payer: Self-pay

## 2023-03-03 ENCOUNTER — Emergency Department: Payer: Medicaid Other

## 2023-03-03 DIAGNOSIS — N132 Hydronephrosis with renal and ureteral calculous obstruction: Secondary | ICD-10-CM | POA: Insufficient documentation

## 2023-03-03 DIAGNOSIS — R1032 Left lower quadrant pain: Secondary | ICD-10-CM | POA: Diagnosis present

## 2023-03-03 LAB — COMPREHENSIVE METABOLIC PANEL
ALT: 20 U/L (ref 0–44)
AST: 19 U/L (ref 15–41)
Albumin: 3.9 g/dL (ref 3.5–5.0)
Alkaline Phosphatase: 41 U/L (ref 38–126)
Anion gap: 10 (ref 5–15)
BUN: 15 mg/dL (ref 6–20)
CO2: 21 mmol/L — ABNORMAL LOW (ref 22–32)
Calcium: 8.9 mg/dL (ref 8.9–10.3)
Chloride: 105 mmol/L (ref 98–111)
Creatinine, Ser: 0.8 mg/dL (ref 0.44–1.00)
GFR, Estimated: >60 mL/min (ref >60)
Glucose, Bld: 108 mg/dL — ABNORMAL HIGH (ref 70–99)
Potassium: 3.7 mmol/L (ref 3.5–5.1)
Sodium: 136 mmol/L (ref 135–145)
Total Bilirubin: 0.5 mg/dL (ref 0.3–1.2)
Total Protein: 8 g/dL (ref 6.5–8.1)

## 2023-03-03 LAB — LIPASE, BLOOD: Lipase: 30 U/L (ref 11–51)

## 2023-03-03 LAB — CBC
HCT: 41.5 % (ref 36.0–46.0)
Hemoglobin: 13.3 g/dL (ref 12.0–15.0)
MCH: 29.6 pg (ref 26.0–34.0)
MCHC: 32 g/dL (ref 30.0–36.0)
MCV: 92.2 fL (ref 80.0–100.0)
Platelets: 261 10*3/uL (ref 150–400)
RBC: 4.5 MIL/uL (ref 3.87–5.11)
RDW: 12.6 % (ref 11.5–15.5)
WBC: 9.3 10*3/uL (ref 4.0–10.5)
nRBC: 0 % (ref 0.0–0.2)

## 2023-03-03 LAB — HCG, QUANTITATIVE, PREGNANCY: hCG, Beta Chain, Quant, S: <1 m[IU]/mL (ref <5)

## 2023-03-03 MED ORDER — ONDANSETRON 4 MG PO TBDP
4.0000 mg | ORAL_TABLET | Freq: Once | ORAL | Status: AC | PRN
  Administered 2023-03-03: 4 mg via ORAL
  Filled 2023-03-03: qty 1

## 2023-03-03 MED ORDER — KETOROLAC TROMETHAMINE 60 MG/2ML IM SOLN
60.0000 mg | Freq: Once | INTRAMUSCULAR | Status: AC
  Administered 2023-03-03: 60 mg via INTRAMUSCULAR
  Filled 2023-03-03: qty 2

## 2023-03-03 NOTE — ED Triage Notes (Signed)
LLQ abd pain and L flank pain. Reports radiation down into L groin. Reports sudden onset 1 hr ago and states that pain ebbs and flows. Pt visibly uncomfortable in triage. Pt alert and oriented following commands. Breathing unlabored speaking in full sentences.

## 2023-03-03 NOTE — ED Triage Notes (Signed)
Pt denies emesis but reports nausea. Reports urge to urinate with difficulty doing so. Denies painful urination.

## 2023-03-04 ENCOUNTER — Emergency Department
Admission: EM | Admit: 2023-03-04 | Discharge: 2023-03-04 | Disposition: A | Discharge status: Home / Self Care | Payer: Medicaid Other | Attending: Emergency Medicine | Admitting: Emergency Medicine

## 2023-03-04 DIAGNOSIS — N200 Calculus of kidney: Secondary | ICD-10-CM

## 2023-03-04 LAB — URINALYSIS, ROUTINE W REFLEX MICROSCOPIC
Bacteria, UA: NONE SEEN
Bilirubin Urine: NEGATIVE
Glucose, UA: NEGATIVE mg/dL
Ketones, ur: NEGATIVE mg/dL
Nitrite: NEGATIVE
Protein, ur: 100 mg/dL — AB
RBC / HPF: >50 RBC/hpf (ref 0–5)
Specific Gravity, Urine: 1.036 — ABNORMAL HIGH (ref 1.005–1.030)
WBC, UA: >50 WBC/hpf (ref 0–5)
pH: 5 (ref 5.0–8.0)

## 2023-03-04 LAB — POC URINE PREG, ED

## 2023-03-04 MED ORDER — HYDROCODONE-ACETAMINOPHEN 5-325 MG PO TABS: 2.0000 | ORAL_TABLET | Freq: Once | ORAL | Status: DC

## 2023-03-04 MED ORDER — IBUPROFEN 600 MG PO TABS: 600.0000 mg | ORAL_TABLET | Freq: Four times a day (QID) | ORAL | 0 refills | Status: AC | PRN

## 2023-03-04 MED ORDER — CEPHALEXIN 500 MG PO CAPS: 500.0000 mg | ORAL_CAPSULE | Freq: Three times a day (TID) | ORAL | 0 refills | Status: AC

## 2023-03-04 MED ORDER — HYDROCODONE-ACETAMINOPHEN 5-325 MG PO TABS
1.0000 | ORAL_TABLET | Freq: Once | ORAL | Status: AC
  Administered 2023-03-04: 1 via ORAL
  Filled 2023-03-04: qty 1

## 2023-03-04 MED ORDER — HYDROCODONE-ACETAMINOPHEN 5-325 MG PO TABS: 1.0000 | ORAL_TABLET | Freq: Four times a day (QID) | ORAL | 0 refills | Status: AC | PRN

## 2023-03-04 MED ORDER — ONDANSETRON 4 MG PO TBDP
4.0000 mg | ORAL_TABLET | Freq: Once | ORAL | Status: AC
  Administered 2023-03-04: 4 mg via ORAL
  Filled 2023-03-04: qty 1

## 2023-03-04 MED ORDER — TAMSULOSIN HCL 0.4 MG PO CAPS: 0.4000 mg | ORAL_CAPSULE | Freq: Every day | ORAL | 0 refills | Status: AC

## 2023-03-04 MED ORDER — ONDANSETRON 4 MG PO TBDP: 4.0000 mg | ORAL_TABLET | Freq: Three times a day (TID) | ORAL | 0 refills | Status: AC | PRN

## 2023-03-04 NOTE — ED Notes (Signed)
ED Provider at bedside. 

## 2023-03-04 NOTE — ED Notes (Signed)
All discharge information discussed with patient, including prescriptions. Patient verbalized understanding, and denied further questions at discharge.  Patient in NAD at time of discharge. Patient ambulatory to ER exit, patient discharged home with visitor at bedside.

## 2023-03-04 NOTE — Discharge Instructions (Signed)
You did have a small amount of inflammation in your urine today, so we will start an antibiotic.  Otherwise, take the medications for pain as needed.  Take the Flomax to help passage of the stone.

## 2023-03-04 NOTE — ED Provider Notes (Signed)
[facility] Provider Note    Event Date/Time   First MD Initiated Contact with Patient 03/04/23 [phone]     (approximate)   History   Abdominal Pain and Flank Pain (/)   HPI  Alexandria Chan is a 32 y.o. female here with abdominal pain and flank pain.  The patient states that earlier today, she experienced acute onset of left flank pain.  She states that it was aching, throbbing, and radiated down towards her left lower quadrant.  The pain has essentially resolved while waiting in the waiting room.  She has no history of previous stones or kidney infections.  She had no dysuria or urinary symptoms prior to this.  No fevers.  No vomiting.  She did have some mild nausea.     Physical Exam   Triage Vital Signs: ED Triage Vitals  Enc Vitals Group     BP 03/03/23 2237 (!) 150/98     Pulse Rate 03/03/23 2237 83     Resp 03/03/23 2237 (!) 22     Temp 03/03/23 2237 98.4 F (36.9 C)     Temp Source 03/03/23 2237 Oral     SpO2 03/03/23 2237 96 %     Weight 03/03/23 2234 200 lb (90.7 kg)     Height 03/03/23 2234 5\' 3"  (1.6 m)     Head Circumference --      Peak Flow --      Pain Score 03/03/23 2234 10     Pain Loc --      Pain Edu? --      Excl. in GC? --     Most recent vital signs: Vitals:   03/03/23 2237 03/04/23 0310  BP: (!) 150/98 (!) 128/90  Pulse: 83 68  Resp: (!) 22 20  Temp: 98.4 F (36.9 C) 98.7 F (37.1 C)  SpO2: 96% 100%     General: Awake, no distress.  CV:  Good peripheral perfusion.  Resp:  Normal work of breathing.  Abd:  No distention.  No CVA tenderness.  No rebound or guarding. Other:  Well-appearing, no distress.   ED Results / Procedures / Treatments   Labs (all labs ordered are listed, but only abnormal results are displayed) Labs Reviewed  URINALYSIS, ROUTINE W REFLEX MICROSCOPIC - Abnormal; Notable for the following components:      Result Value   Color, Urine YELLOW (*)    APPearance CLOUDY (*)    Specific  Gravity, Urine 1.036 (*)    Hgb urine dipstick LARGE (*)    Protein, ur 100 (*)    Leukocytes,Ua MODERATE (*)    Non Squamous Epithelial PRESENT (*)    All other components within normal limits  COMPREHENSIVE METABOLIC PANEL - Abnormal; Notable for the following components:   CO2 21 (*)    Glucose, Bld 108 (*)    All other components within normal limits  POC URINE PREG, ED - Normal  URINE CULTURE  CBC  LIPASE, BLOOD  HCG, QUANTITATIVE, PREGNANCY     EKG    RADIOLOGY 4 mm ureteral stone in the left UVJ   I also independently reviewed and agree with radiologist interpretations.   PROCEDURES:  Critical Care performed: No   MEDICATIONS ORDERED IN ED: Medications  ondansetron (ZOFRAN-ODT) disintegrating tablet 4 mg (4 mg Oral Given 03/03/23 2247)  ketorolac (TORADOL) injection 60 mg (60 mg Intramuscular Given 03/03/23 2247)  ondansetron (ZOFRAN-ODT) disintegrating tablet 4 mg (4 mg Oral Given 03/04/23 0310)  HYDROcodone-acetaminophen (NORCO/VICODIN) 5-325 MG per tablet 1 tablet (1 tablet Oral Given 03/04/23 0310)     IMPRESSION / MDM / ASSESSMENT AND PLAN / ED COURSE  I reviewed the triage vital signs and the nursing notes.                              Differential diagnosis includes, but is not limited to, renal colic, UTI, pyelonephritis, colitis, diverticulitis, obstruction, ovarian cyst, torsion  Patient's presentation is most consistent with acute presentation with potential threat to life or bodily function.  48 old female here with left flank pain, now largely improved.  She has a 4 mm ureteral stone at the left UVJ, with minimal hydro-.  She has no fevers, chills, vomiting, or signs to suggest sepsis.  White count is normal.  CMP shows normal renal function and LFTs.  UA does show some pyuria which I suspect is reactive and she has 11-20 squamous cells which I suspect is contamination.  She has no bacteria present.  Given resolution of her symptoms with no CVA  tenderness or leukocytosis, do not suspect infected stone at this time.  Will treat with analgesia and encourage hydration, outpatient follow-up.  Return precautions given.      FINAL CLINICAL IMPRESSION(S) / ED DIAGNOSES   Final diagnoses:  Kidney stone     Rx / DC Orders   ED Discharge Orders          Ordered    HYDROcodone-acetaminophen (NORCO/VICODIN) 5-325 MG tablet  Every 6 hours PRN        03/04/23 0352    ondansetron (ZOFRAN-ODT) 4 MG disintegrating tablet  Every 8 hours PRN        03/04/23 0352    ibuprofen (ADVIL) 600 MG tablet  Every 6 hours PRN        03/04/23 0352    tamsulosin (FLOMAX) 0.4 MG CAPS capsule  Daily after supper        03/04/23 1610             Note:  This document was prepared using Dragon voice recognition software and may include unintentional dictation errors.   Shaune Pollack, MD 03/04/23 [phone]

## 2023-03-05 LAB — URINE CULTURE: Culture: NO GROWTH

## 2023-03-09 ENCOUNTER — Other Ambulatory Visit: Payer: Self-pay | Admitting: *Deleted

## 2023-03-09 DIAGNOSIS — N200 Calculus of kidney: Secondary | ICD-10-CM

## 2023-04-02 ENCOUNTER — Ambulatory Visit: Payer: Self-pay | Admitting: Urology
